# Patient Record
Sex: Female | Born: 1970 | Race: White | Hispanic: No | Marital: Married | Smoking: Current some day smoker
Health system: Southern US, Community
[De-identification: ages and names within clinical notes are randomized; demographics above are authoritative.]

## PROBLEM LIST (undated history)

## (undated) DIAGNOSIS — Z8679 Personal history of other diseases of the circulatory system: Secondary | ICD-10-CM

## (undated) HISTORY — PX: EYE SURGERY: SHX253

---

## 2005-09-21 HISTORY — PX: BACK SURGERY: SHX140

## 2006-07-29 ENCOUNTER — Ambulatory Visit: Payer: Self-pay | Admitting: Internal Medicine

## 2006-08-11 ENCOUNTER — Ambulatory Visit (HOSPITAL_COMMUNITY): Admission: RE | Admit: 2006-08-11 | Discharge: 2006-08-12 | Payer: Self-pay | Admitting: Neurological Surgery

## 2006-09-06 ENCOUNTER — Encounter: Admission: RE | Admit: 2006-09-06 | Discharge: 2006-09-06 | Payer: Self-pay | Admitting: Neurological Surgery

## 2008-01-18 ENCOUNTER — Ambulatory Visit: Payer: Self-pay

## 2008-09-21 HISTORY — PX: BREAST SURGERY: SHX581

## 2013-06-21 ENCOUNTER — Other Ambulatory Visit: Payer: Self-pay | Admitting: Neurological Surgery

## 2013-06-26 ENCOUNTER — Other Ambulatory Visit (HOSPITAL_COMMUNITY): Payer: Self-pay | Admitting: *Deleted

## 2013-06-26 ENCOUNTER — Encounter (HOSPITAL_COMMUNITY)
Admission: RE | Admit: 2013-06-26 | Discharge: 2013-06-26 | Disposition: A | Discharge status: Home / Self Care | Payer: Managed Care, Other (non HMO) | Source: Ambulatory Visit | Attending: Neurological Surgery | Admitting: Neurological Surgery

## 2013-06-26 ENCOUNTER — Encounter (HOSPITAL_COMMUNITY): Payer: Self-pay

## 2013-06-26 HISTORY — DX: Personal history of other diseases of the circulatory system: Z86.79

## 2013-06-26 LAB — CBC WITH DIFFERENTIAL/PLATELET
Basophils Absolute: 0 K/uL (ref 0.0–0.1)
Basophils Relative: 0 % (ref 0–1)
Eosinophils Absolute: 0.2 K/uL (ref 0.0–0.7)
Eosinophils Relative: 3 % (ref 0–5)
HCT: 42.5 % (ref 36.0–46.0)
Hemoglobin: 13.9 g/dL (ref 12.0–15.0)
Lymphocytes Relative: 20 % (ref 12–46)
Lymphs Abs: 1.7 K/uL (ref 0.7–4.0)
MCH: 31.2 pg (ref 26.0–34.0)
MCHC: 32.7 g/dL (ref 30.0–36.0)
MCV: 95.5 fL (ref 78.0–100.0)
Monocytes Absolute: 0.5 K/uL (ref 0.1–1.0)
Monocytes Relative: 6 % (ref 3–12)
Neutro Abs: 6.2 K/uL (ref 1.7–7.7)
Neutrophils Relative %: 71 % (ref 43–77)
Platelets: 246 K/uL (ref 150–400)
RBC: 4.45 MIL/uL (ref 3.87–5.11)
RDW: 13.1 % (ref 11.5–15.5)
WBC: 8.7 K/uL (ref 4.0–10.5)

## 2013-06-26 LAB — BASIC METABOLIC PANEL
CO2: 26 mEq/L (ref 19–32)
Calcium: 9.1 mg/dL (ref 8.4–10.5)
Chloride: 102 mEq/L (ref 96–112)
Creatinine, Ser: 0.62 mg/dL (ref 0.50–1.10)
GFR calc non Af Amer: >90 mL/min (ref >90)
Glucose, Bld: 87 mg/dL (ref 70–99)
Sodium: 138 mEq/L (ref 135–145)

## 2013-06-26 LAB — HCG, SERUM, QUALITATIVE: Preg, Serum: NEGATIVE

## 2013-06-26 LAB — PROTIME-INR
INR: 0.93 (ref 0.00–1.49)
Prothrombin Time: 12.3 s (ref 11.6–15.2)

## 2013-06-26 LAB — SURGICAL PCR SCREEN
MRSA, PCR: NEGATIVE
Staphylococcus aureus: NEGATIVE

## 2013-06-26 NOTE — Pre-Procedure Instructions (Signed)
Faline L Tanner  06/26/2013   Your procedure is scheduled on:  Thursday, June 29, 2013 at 3:45 PM.   Report to [facility] Entrance "A" at 1:45 PM.   Call this number if you have problems the morning of surgery: [phone]   Remember:   Do not eat food or drink liquids after midnight Wednesday, 06/28/13.   Take these medicines the morning of surgery with A SIP OF WATER: HYDROcodone-acetaminophen (VICODIN) Stop Ibuprofen as of today.   Do not wear jewelry, make-up or nail polish.  Do not wear lotions, powders, or perfumes. You may wear deodorant.  Do not shave 48 hours prior to surgery.   Do not bring valuables to the hospital.  [facility] is not responsible                  for any belongings or valuables.               Contacts, dentures or bridgework may not be worn into surgery.  Leave suitcase in the car. After surgery it may be brought to your room.  For patients admitted to the hospital, discharge time is determined by your                treatment team.                 Special Instructions: Shower using CHG 2 nights before surgery and the night before surgery.  If you shower the day of surgery use CHG.  Use special wash - you have one bottle of CHG for all showers.  You should use approximately 1/3 of the bottle for each shower.   Please read over the following fact sheets that you were given: Pain Booklet, Coughing and Deep Breathing, MRSA Information and Surgical Site Infection Prevention

## 2013-06-27 NOTE — Progress Notes (Signed)
Anesthesia Chart Review:  Patient is a 42 year old female scheduled for C6-7 ACDF, removal of C5-6 plate on 81/19/14 by Dr. Yetta Barre.  History includes smoking, MVP, neck surgery '07, breast augmentation '10, right eye surgery for amblyopia as a child.  No PCP is listed.  EKG on 06/26/13 showed NSR, possible LAE, low voltage QRS, cannot rule out anterior infarct (age undetermined).  The interpreting cardiology did not feel that it was significantly changed since her last tracing--although I could not locate a previous tracing in Epic or Muse. No CV symptoms were documented at her PAT visit.  CXR on 06/26/13 showed no active cardiopulmonary disease.  Preoperative labs noted.  She has a history of MVP and smoking, otherwise no known history of HTN, CAD, MI, CHF, or DM.  Clinical correlation on the day of surgery, but if she remains asymptomatic then I would anticipate that she could proceed as planned.  Velna Ochs [facility]/Anesthesiology Phone [phone] 06/27/2013 11:18 AM

## 2013-06-28 MED ORDER — CEFAZOLIN SODIUM-DEXTROSE 2-3 GM-% IV SOLR
2.0000 g | INTRAVENOUS | Status: AC
  Administered 2013-06-29: 2 g via INTRAVENOUS
  Filled 2013-06-28: qty 50

## 2013-06-28 NOTE — Progress Notes (Signed)
Contacted pt regarding new arrival time; pt stated that she was told to arrive at 11:00AM on DOS.

## 2013-06-29 ENCOUNTER — Ambulatory Visit (HOSPITAL_COMMUNITY)
Admission: RE | Admit: 2013-06-29 | Discharge: 2013-06-30 | Disposition: A | Discharge status: Home / Self Care | Payer: Managed Care, Other (non HMO) | Source: Ambulatory Visit | Attending: Neurological Surgery | Admitting: Neurological Surgery

## 2013-06-29 ENCOUNTER — Encounter (HOSPITAL_COMMUNITY): Payer: Managed Care, Other (non HMO) | Admitting: Vascular Surgery

## 2013-06-29 ENCOUNTER — Ambulatory Visit (HOSPITAL_COMMUNITY): Payer: Managed Care, Other (non HMO) | Admitting: Certified Registered Nurse Anesthetist

## 2013-06-29 ENCOUNTER — Observation Stay (HOSPITAL_COMMUNITY): Payer: Managed Care, Other (non HMO)

## 2013-06-29 ENCOUNTER — Encounter (HOSPITAL_COMMUNITY): Payer: Self-pay | Admitting: *Deleted

## 2013-06-29 ENCOUNTER — Encounter (HOSPITAL_COMMUNITY)
Admission: RE | Disposition: A | Discharge status: Home / Self Care | Payer: Self-pay | Source: Ambulatory Visit | Attending: Neurological Surgery

## 2013-06-29 DIAGNOSIS — F17200 Nicotine dependence, unspecified, uncomplicated: Secondary | ICD-10-CM | POA: Insufficient documentation

## 2013-06-29 DIAGNOSIS — Z981 Arthrodesis status: Secondary | ICD-10-CM | POA: Insufficient documentation

## 2013-06-29 DIAGNOSIS — M5020 Other cervical disc displacement, unspecified cervical region: Secondary | ICD-10-CM | POA: Insufficient documentation

## 2013-06-29 HISTORY — PX: ANTERIOR CERVICAL DECOMP/DISCECTOMY FUSION: SHX1161

## 2013-06-29 SURGERY — ANTERIOR CERVICAL DECOMPRESSION/DISCECTOMY FUSION 1 LEVEL/HARDWARE REMOVAL
Anesthesia: General | Site: Neck | Wound class: Clean

## 2013-06-29 MED ORDER — GLYCOPYRROLATE 0.2 MG/ML IJ SOLN
INTRAMUSCULAR | Status: DC | PRN
  Administered 2013-06-29: 0.4 mg via INTRAVENOUS

## 2013-06-29 MED ORDER — PROMETHAZINE HCL 25 MG/ML IJ SOLN: 6.2500 mg | INTRAMUSCULAR | Status: DC | PRN

## 2013-06-29 MED ORDER — DEXAMETHASONE SODIUM PHOSPHATE 10 MG/ML IJ SOLN
INTRAMUSCULAR | Status: AC
  Filled 2013-06-29: qty 1

## 2013-06-29 MED ORDER — DEXAMETHASONE SODIUM PHOSPHATE 4 MG/ML IJ SOLN
4.0000 mg | Freq: Four times a day (QID) | INTRAMUSCULAR | Status: DC
  Administered 2013-06-29: 4 mg via INTRAVENOUS
  Filled 2013-06-29 (×5): qty 1

## 2013-06-29 MED ORDER — SODIUM CHLORIDE 0.9 % IJ SOLN: 3.0000 mL | INTRAMUSCULAR | Status: DC | PRN

## 2013-06-29 MED ORDER — ACETAMINOPHEN 325 MG PO TABS: 650.0000 mg | ORAL_TABLET | ORAL | Status: DC | PRN

## 2013-06-29 MED ORDER — DEXAMETHASONE 4 MG PO TABS
4.0000 mg | ORAL_TABLET | Freq: Four times a day (QID) | ORAL | Status: DC
  Administered 2013-06-30 (×2): 4 mg via ORAL
  Filled 2013-06-29 (×7): qty 1

## 2013-06-29 MED ORDER — OXYCODONE HCL 5 MG PO TABS: 5.0000 mg | ORAL_TABLET | Freq: Once | ORAL | Status: DC | PRN

## 2013-06-29 MED ORDER — HEMOSTATIC AGENTS (NO CHARGE) OPTIME
TOPICAL | Status: DC | PRN
  Administered 2013-06-29: 1 via TOPICAL

## 2013-06-29 MED ORDER — THROMBIN 5000 UNITS EX SOLR
CUTANEOUS | Status: DC | PRN
  Administered 2013-06-29 (×2): 5000 [IU] via TOPICAL

## 2013-06-29 MED ORDER — MIDAZOLAM HCL 2 MG/2ML IJ SOLN: 0.5000 mg | Freq: Once | INTRAMUSCULAR | Status: DC | PRN

## 2013-06-29 MED ORDER — BUPIVACAINE HCL (PF) 0.25 % IJ SOLN
INTRAMUSCULAR | Status: DC | PRN
  Administered 2013-06-29: 3 mL

## 2013-06-29 MED ORDER — MEPERIDINE HCL 25 MG/ML IJ SOLN: 6.2500 mg | INTRAMUSCULAR | Status: DC | PRN

## 2013-06-29 MED ORDER — MIDAZOLAM HCL 5 MG/5ML IJ SOLN
INTRAMUSCULAR | Status: DC | PRN
  Administered 2013-06-29 (×2): 2 mg via INTRAVENOUS

## 2013-06-29 MED ORDER — ONDANSETRON HCL 4 MG/2ML IJ SOLN
INTRAMUSCULAR | Status: DC | PRN
  Administered 2013-06-29: 4 mg via INTRAMUSCULAR

## 2013-06-29 MED ORDER — NEOSTIGMINE METHYLSULFATE 1 MG/ML IJ SOLN
INTRAMUSCULAR | Status: DC | PRN
  Administered 2013-06-29: 3 mg via INTRAVENOUS

## 2013-06-29 MED ORDER — ACETAMINOPHEN 650 MG RE SUPP: 650.0000 mg | RECTAL | Status: DC | PRN

## 2013-06-29 MED ORDER — LACTATED RINGERS IV SOLN
INTRAVENOUS | Status: DC | PRN
  Administered 2013-06-29 (×2): via INTRAVENOUS

## 2013-06-29 MED ORDER — LACTATED RINGERS IV SOLN
INTRAVENOUS | Status: DC
  Administered 2013-06-29: 11:00:00 via INTRAVENOUS

## 2013-06-29 MED ORDER — THROMBIN 5000 UNITS EX SOLR
OROMUCOSAL | Status: DC | PRN
  Administered 2013-06-29: 14:00:00 via TOPICAL

## 2013-06-29 MED ORDER — HYDROCODONE-ACETAMINOPHEN 5-325 MG PO TABS
ORAL_TABLET | ORAL | Status: AC
  Filled 2013-06-29: qty 2

## 2013-06-29 MED ORDER — CYCLOBENZAPRINE HCL 10 MG PO TABS
10.0000 mg | ORAL_TABLET | Freq: Three times a day (TID) | ORAL | Status: DC | PRN
  Administered 2013-06-29: 10 mg via ORAL

## 2013-06-29 MED ORDER — HYDROCODONE-ACETAMINOPHEN 5-325 MG PO TABS
1.0000 | ORAL_TABLET | ORAL | Status: DC | PRN
  Administered 2013-06-29 – 2013-06-30 (×5): 2 via ORAL
  Filled 2013-06-29 (×4): qty 2

## 2013-06-29 MED ORDER — SODIUM CHLORIDE 0.9 % IR SOLN
Status: DC | PRN
  Administered 2013-06-29: 14:00:00

## 2013-06-29 MED ORDER — PROPOFOL 10 MG/ML IV BOLUS
INTRAVENOUS | Status: DC | PRN
  Administered 2013-06-29: 200 mg via INTRAVENOUS

## 2013-06-29 MED ORDER — POTASSIUM CHLORIDE IN NACL 20-0.9 MEQ/L-% IV SOLN
INTRAVENOUS | Status: DC
  Administered 2013-06-29: 19:00:00 via INTRAVENOUS
  Filled 2013-06-29 (×3): qty 1000

## 2013-06-29 MED ORDER — DEXAMETHASONE SODIUM PHOSPHATE 10 MG/ML IJ SOLN
10.0000 mg | INTRAMUSCULAR | Status: AC
  Administered 2013-06-29: 10 mg via INTRAVENOUS

## 2013-06-29 MED ORDER — ARTIFICIAL TEARS OP OINT
TOPICAL_OINTMENT | OPHTHALMIC | Status: DC | PRN
  Administered 2013-06-29: 1 via OPHTHALMIC

## 2013-06-29 MED ORDER — EPHEDRINE SULFATE 50 MG/ML IJ SOLN
INTRAMUSCULAR | Status: DC | PRN
  Administered 2013-06-29 (×3): 5 mg via INTRAVENOUS

## 2013-06-29 MED ORDER — ROCURONIUM BROMIDE 100 MG/10ML IV SOLN
INTRAVENOUS | Status: DC | PRN
  Administered 2013-06-29: 50 mg via INTRAVENOUS

## 2013-06-29 MED ORDER — SODIUM CHLORIDE 0.9 % IJ SOLN: 3.0000 mL | Freq: Two times a day (BID) | INTRAMUSCULAR | Status: DC

## 2013-06-29 MED ORDER — CYCLOBENZAPRINE HCL 10 MG PO TABS
ORAL_TABLET | ORAL | Status: AC
  Filled 2013-06-29: qty 1

## 2013-06-29 MED ORDER — ONDANSETRON HCL 4 MG/2ML IJ SOLN: 4.0000 mg | INTRAMUSCULAR | Status: DC | PRN

## 2013-06-29 MED ORDER — PHENOL 1.4 % MT LIQD: 1.0000 | OROMUCOSAL | Status: DC | PRN

## 2013-06-29 MED ORDER — FENTANYL CITRATE 0.05 MG/ML IJ SOLN
INTRAMUSCULAR | Status: DC | PRN
  Administered 2013-06-29: 100 ug via INTRAVENOUS
  Administered 2013-06-29: 50 ug via INTRAVENOUS
  Administered 2013-06-29: 100 ug via INTRAVENOUS
  Administered 2013-06-29: 50 ug via INTRAVENOUS

## 2013-06-29 MED ORDER — HYDROMORPHONE HCL PF 1 MG/ML IJ SOLN: 0.2500 mg | INTRAMUSCULAR | Status: DC | PRN

## 2013-06-29 MED ORDER — MORPHINE SULFATE 2 MG/ML IJ SOLN
1.0000 mg | INTRAMUSCULAR | Status: DC | PRN
Start: 2013-06-29 — End: 2013-06-30

## 2013-06-29 MED ORDER — SODIUM CHLORIDE 0.9 % IV SOLN: 250.0000 mL | INTRAVENOUS | Status: DC

## 2013-06-29 MED ORDER — 0.9 % SODIUM CHLORIDE (POUR BTL) OPTIME
TOPICAL | Status: DC | PRN
  Administered 2013-06-29: 1000 mL

## 2013-06-29 MED ORDER — MENTHOL 3 MG MT LOZG: 1.0000 | LOZENGE | OROMUCOSAL | Status: DC | PRN

## 2013-06-29 MED ORDER — CELECOXIB 200 MG PO CAPS
200.0000 mg | ORAL_CAPSULE | Freq: Two times a day (BID) | ORAL | Status: DC
  Filled 2013-06-29 (×3): qty 1

## 2013-06-29 MED ORDER — CEFAZOLIN SODIUM 1-5 GM-% IV SOLN
1.0000 g | Freq: Three times a day (TID) | INTRAVENOUS | Status: AC
  Administered 2013-06-29 – 2013-06-30 (×2): 1 g via INTRAVENOUS
  Filled 2013-06-29 (×2): qty 50

## 2013-06-29 MED ORDER — OXYCODONE HCL 5 MG/5ML PO SOLN: 5.0000 mg | Freq: Once | ORAL | Status: DC | PRN

## 2013-06-29 MED ORDER — LIDOCAINE HCL (CARDIAC) 20 MG/ML IV SOLN
INTRAVENOUS | Status: DC | PRN
  Administered 2013-06-29: 40 mg via INTRAVENOUS

## 2013-06-29 SURGICAL SUPPLY — 49 items
ALLOGRAFT TRIAD LORDOTIC CC (Bone Implant) ×1 IMPLANT
APL SKNCLS STERI-STRIP NONHPOA (GAUZE/BANDAGES/DRESSINGS) ×1
BAG DECANTER FOR FLEXI CONT (MISCELLANEOUS) ×2 IMPLANT
BENZOIN TINCTURE PRP APPL 2/3 (GAUZE/BANDAGES/DRESSINGS) ×2 IMPLANT
BIT DRILL (BIT) ×1
BIT DRILL 11MM (BIT) IMPLANT
BUR MATCHSTICK NEURO 3.0 LAGG (BURR) ×2 IMPLANT
CANISTER SUCTION 2500CC (MISCELLANEOUS) ×2 IMPLANT
CLOTH BEACON ORANGE TIMEOUT ST (SAFETY) ×2 IMPLANT
CONT SPEC 4OZ CLIKSEAL STRL BL (MISCELLANEOUS) ×2 IMPLANT
DRAPE C-ARM 42X72 X-RAY (DRAPES) ×4 IMPLANT
DRAPE LAPAROTOMY 100X72 PEDS (DRAPES) ×2 IMPLANT
DRAPE MICROSCOPE ZEISS OPMI (DRAPES) ×2 IMPLANT
DRAPE POUCH INSTRU U-SHP 10X18 (DRAPES) ×2 IMPLANT
DRESSING TELFA 8X3 (GAUZE/BANDAGES/DRESSINGS) ×2 IMPLANT
DRSG OPSITE 4X5.5 SM (GAUZE/BANDAGES/DRESSINGS) ×2 IMPLANT
DURAPREP 6ML APPLICATOR 50/CS (WOUND CARE) ×2 IMPLANT
ELECT COATED BLADE 2.86 ST (ELECTRODE) ×2 IMPLANT
ELECT REM PT RETURN 9FT ADLT (ELECTROSURGICAL) ×2
ELECTRODE REM PT RTRN 9FT ADLT (ELECTROSURGICAL) ×1 IMPLANT
GAUZE SPONGE 4X4 16PLY XRAY LF (GAUZE/BANDAGES/DRESSINGS) IMPLANT
GLOVE BIO SURGEON STRL SZ8 (GLOVE) ×2 IMPLANT
GOWN BRE IMP SLV AUR LG STRL (GOWN DISPOSABLE) IMPLANT
GOWN BRE IMP SLV AUR XL STRL (GOWN DISPOSABLE) IMPLANT
GOWN STRL REIN 2XL LVL4 (GOWN DISPOSABLE) ×2 IMPLANT
HEAD HALTER (SOFTGOODS) IMPLANT
HEMOSTAT POWDER KIT SURGIFOAM (HEMOSTASIS) ×2 IMPLANT
KIT BASIN OR (CUSTOM PROCEDURE TRAY) ×2 IMPLANT
KIT ROOM TURNOVER OR (KITS) ×2 IMPLANT
NDL HYPO 25X1 1.5 SAFETY (NEEDLE) ×1 IMPLANT
NDL SPNL 20GX3.5 QUINCKE YW (NEEDLE) ×1 IMPLANT
NEEDLE HYPO 25X1 1.5 SAFETY (NEEDLE) ×2 IMPLANT
NEEDLE SPNL 20GX3.5 QUINCKE YW (NEEDLE) ×2 IMPLANT
NS IRRIG 1000ML POUR BTL (IV SOLUTION) ×2 IMPLANT
PACK LAMINECTOMY NEURO (CUSTOM PROCEDURE TRAY) ×2 IMPLANT
PAD ARMBOARD 7.5X6 YLW CONV (MISCELLANEOUS) ×6 IMPLANT
PLATE ACP VENTURE (Plate) ×1 IMPLANT
RUBBERBAND STERILE (MISCELLANEOUS) ×4 IMPLANT
SCREW VA 4.0X13MM (Screw) ×2 IMPLANT
SCREW VA 4.5X13MM (Screw) ×2 IMPLANT
SPONGE INTESTINAL PEANUT (DISPOSABLE) ×2 IMPLANT
STRIP CLOSURE SKIN 1/2X4 (GAUZE/BANDAGES/DRESSINGS) ×2 IMPLANT
SUT VIC AB 3-0 SH 8-18 (SUTURE) ×2 IMPLANT
SYR 20ML ECCENTRIC (SYRINGE) ×2 IMPLANT
TAPE STRIPS DRAPE STRL (GAUZE/BANDAGES/DRESSINGS) ×1 IMPLANT
TOWEL OR 17X24 6PK STRL BLUE (TOWEL DISPOSABLE) ×2 IMPLANT
TOWEL OR 17X26 10 PK STRL BLUE (TOWEL DISPOSABLE) ×2 IMPLANT
TRAP SPECIMEN MUCOUS 40CC (MISCELLANEOUS) IMPLANT
WATER STERILE IRR 1000ML POUR (IV SOLUTION) ×2 IMPLANT

## 2013-06-29 NOTE — H&P (Signed)
Subjective:   Patient is a 42 y.o. female admitted for ACDF C6-7. The patient first presented to me with complaints of neck pain, shooting pains in the arm(s) and numbness of the arm(s). Onset of symptoms was a few weeks ago. The pain is described as sharp and stabbing and occurs all day. The pain is rated intense, and is located  At the base of the neck and radiates to the RUE. The symptoms have been progressive. Symptoms are exacerbated by extending head backwards, and are relieved by none.  Previous work up includes MRI of cervical spine, results: disc bulge at C6-C7 right.  Past Medical History  Diagnosis Date  . H/O mitral valve prolapse     states she has been told that she no longer has this    Past Surgical History  Procedure Laterality Date  . 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 Breast surgery  2010    augmentation  . Back surgery  2007    neck surgery    No Known Allergies  History  Substance Use Topics  . Smoking status: Current Some Day Smoker  . Smokeless tobacco: Never Used  . Alcohol Use: Yes     Comment: socially    History reviewed. No pertinent family history. Prior to Admission medications   Medication Sig Start Date End Date Taking? Authorizing Provider  HYDROcodone-acetaminophen (VICODIN) 5-500 MG per tablet Take 1 tablet by mouth every 6 (six) hours as needed for pain.   Yes Historical Provider, MD  ibuprofen (ADVIL,MOTRIN) 800 MG tablet Take 800 mg by mouth every 8 (eight) hours as needed for pain.   Yes Historical Provider, MD     Review of Systems  Positive ROS: neg  All other systems have been reviewed and were otherwise negative with the exception of those mentioned in the HPI and as above.  Objective: Vital signs in last 24 hours: Temp:  [98.3 F (36.8 C)] 98.3 F (36.8 C) (10/09 1047) Pulse Rate:  [68] 68 (10/09 1047) Resp:  [18] 18 (10/09 1047) BP: (135)/(91) 135/91 mmHg (10/09 1047) SpO2:  [99 %] 99 % (10/09  1047)  General Appearance: Alert, cooperative, no distress, appears stated age Head: Normocephalic, without obvious abnormality, atraumatic Eyes: PERRL, conjunctiva/corneas clear, EOM's intact      Neck: Supple, symmetrical, trachea midline, Back: Symmetric, no curvature, ROM normal, no CVA tenderness Lungs:  respirations unlabored Heart: Regular rate and rhythm Abdomen: Soft, non-tender Extremities: Extremities normal, atraumatic, no cyanosis or edema Pulses: 2+ and symmetric all extremities Skin: Skin color, texture, turgor normal, no rashes or lesions  NEUROLOGIC:  Mental status: Alert and oriented x4, no aphasia, good attention span, fund of knowledge and memory  Motor Exam - grossly normal Sensory Exam - grossly normal Reflexes: 1+ Coordination - grossly normal Gait - grossly normal Balance - grossly normal Cranial Nerves: I: smell Not tested  II: visual acuity  OS: nl    OD: nl  II: visual fields Full to confrontation  II: pupils Equal, round, reactive to light  III,VII: ptosis None  III,IV,VI: extraocular muscles  Full ROM  V: mastication Normal  V: facial light touch sensation  Normal  V,VII: corneal reflex  Present  VII: facial muscle function - upper  Normal  VII: facial muscle function - lower Normal  VIII: hearing Not tested  IX: soft palate elevation  Normal  IX,X: gag reflex Present  XI: trapezius strength  5/5  XI: sternocleidomastoid  strength 5/5  XI: neck flexion strength  5/5  XII: tongue strength  Normal    Data Review Lab Results  Component Value Date   WBC 8.7 06/26/2013   HGB 13.9 06/26/2013   HCT 42.5 06/26/2013   MCV 95.5 06/26/2013   PLT 246 06/26/2013   Lab Results  Component Value Date   NA 138 06/26/2013   K 3.8 06/26/2013   CL 102 06/26/2013   CO2 26 06/26/2013   BUN 15 06/26/2013   CREATININE 0.62 06/26/2013   GLUCOSE 87 06/26/2013   Lab Results  Component Value Date   INR 0.93 06/26/2013    Assessment:   Cervical neck pain with  herniated nucleus pulposus/ spondylosis/ stenosis at C6_7. Patient has failed conservative therapy. Planned surgery : ACDF C6-7  Plan:   I explained the condition and procedure to the patient and answered any questions.  Patient wishes to proceed with procedure as planned. Understands risks/ benefits/ and expected or typical outcomes.  Raysa Bosak S 06/29/2013 1:00 PM

## 2013-06-29 NOTE — Anesthesia Preprocedure Evaluation (Signed)
Anesthesia Evaluation  Patient identified by MRN, date of birth, ID band Patient awake    Reviewed: Allergy & Precautions, H&P , NPO status , Patient's Chart, lab work & pertinent test results  History of Anesthesia Complications Negative for: history of anesthetic complications  Airway Mallampati: II TM Distance: >3 FB Neck ROM: Full    Dental  (+) Teeth Intact and Dental Advisory Given   Pulmonary Current Smoker,  breath sounds clear to auscultation  Pulmonary exam normal       Cardiovascular negative cardio ROS  Rhythm:Regular Rate:Normal     Neuro/Psych Chronic neck pain: narcotics daily    GI/Hepatic negative GI ROS, Neg liver ROS,   Endo/Other  negative endocrine ROS  Renal/GU negative Renal ROS     Musculoskeletal   Abdominal   Peds  Hematology   Anesthesia Other Findings   Reproductive/Obstetrics IUD, LMP last week                           Anesthesia Physical Anesthesia Plan  ASA: II  Anesthesia Plan: General   Post-op Pain Management:    Induction: Intravenous  Airway Management Planned: Oral ETT  Additional Equipment:   Intra-op Plan:   Post-operative Plan: Extubation in OR  Informed Consent: I have reviewed the patients History and Physical, chart, labs and discussed the procedure including the risks, benefits and alternatives for the proposed anesthesia with the patient or authorized representative who has indicated his/her understanding and acceptance.   Dental advisory given  Plan Discussed with: CRNA and Surgeon  Anesthesia Plan Comments: (Plan routine monitors, GETA)        Anesthesia Quick Evaluation

## 2013-06-29 NOTE — Preoperative (Signed)
Beta Blockers   Reason not to administer Beta Blockers:Not Applicable 

## 2013-06-29 NOTE — Op Note (Signed)
06/29/2013  2:51 PM  PATIENT:  Monique Green  42 y.o. 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 Decompressive anterior cervical discectomy C6-7, 2. Removal of C5-6 anterior cervical plate, 3. Anterior cervical arthrodesis C6-7 utilizing a cortical cancellus allograft, 4. Anterior cervical plating C6-7 utilizing an Atlantis venture plate  SURGEON:  Marikay Alar, MD  ASSISTANTS: Dr. Newell Coral  ANESTHESIA:   General  EBL: 30 ml  Total I/O In: 1000 [I.V.:1000] Out: 30 [Blood:30]  BLOOD ADMINISTERED:none  DRAINS: None   SPECIMEN:  No Specimen  INDICATION FOR PROCEDURE: This patient presented with severe right arm pain. She had a previous ACDF with plating at C5-6. Imaging showed a large cervical disc herniation C6-7on the right. Recommended decompressive anterior cervical discectomy fusion plating at C6-7. Patient understood the risks, benefits, and alternatives and potential outcomes and wished to proceed.  PROCEDURE DETAILS: Patient was brought to the operating room placed under general endotracheal anesthesia. Patient was placed in the supine position on the operating room table. The neck was prepped with Duraprep and draped in a sterile fashion.   Three cc of local anesthesia was injected and a transverse incision was made on the right side of the neck.  Dissection was carried down thru the subcutaneous tissue and the platysma was  elevated, opened, and undermined with Metzenbaum scissors.  Dissection was then carried out thru an avascular plane leaving the sternocleidomastoid carotid artery and jugular vein laterally and the trachea and esophagus medially. The ventral aspect of the vertebral column was identified and a localizing x-ray was taken. The C6-7 level and the previous anterior cervical plate were identified. I bluntly removed the soft tissues from the surface of  the previously placed anterior cervical plate. I removed the 4 screws, filled the screw holes with Surgifoam , and removed the previously placed plate. The longus colli muscles were then elevated and the retractor was placed to expose C6-7. The annulus was incised and the disc space entered. Discectomy was performed with micro-curettes and pituitary rongeurs. I then used the high-speed drill to drill the endplates down to the level of the posterior longitudinal ligament. The drill shavings were saved in a mucous trap for later arthrodesis. The operating microscope was draped and brought into the field provided additional magnification, illumination and visualization. Discectomy was continued posteriorly thru the disc space. Posterior longitudinal ligament was opened with a nerve hook, and then removed along with disc herniation and osteophytes, decompressing the spinal canal and thecal sac. We then continued to remove osteophytic overgrowth and disc material decompressing the neural foramina and exiting nerve roots bilaterally. The scope was angled up and down to help decompress and undercut the vertebral bodies. Once the decompression was completed we could pass a nerve hook circumferentially to assure adequate decompression in the midline and in the neural foramina. So by both visualization and palpation we felt we had an adequate decompression of the neural elements. We then measured the height of the intravertebral disc space and selected a 6 millimeter cortical cancellus allograft. It was then gently positioned in the intravertebral disc space and countersunk. I then used a Atlantis venture plate and placed four variable angle screws into the vertebral bodies and locked them into position. The wound was irrigated with bacitracin solution, checked for hemostasis which was established and confirmed. Once meticulous hemostasis was achieved, we then proceeded with closure. The platysma was closed with  interrupted  3-0 undyed Vicryl suture, the subcuticular layer was closed with interrupted 3-0 undyed Vicryl suture. The skin edges were approximated with steristrips. The drapes were removed. A sterile dressing was applied. The patient was then awakened from general anesthesia and transferred to the recovery room in stable condition. At the end of the procedure all sponge, needle and instrument counts were correct.   PLAN OF CARE: Admit for overnight observation  PATIENT DISPOSITION:  PACU - hemodynamically stable.   Delay start of Pharmacological VTE agent (>24hrs) due to surgical blood loss or risk of bleeding:  yes

## 2013-06-29 NOTE — Transfer of Care (Signed)
Immediate Anesthesia Transfer of Care Note  Patient: Monique Green  Procedure(s) Performed: Procedure(s) with comments: ANTERIOR CERVICAL DECOMPRESSION/DISCECTOMY FUSION 1 LEVEL/HARDWARE REMOVAL (N/A) - ANTERIOR CERVICAL DECOMPRESSION/DISCECTOMY FUSION 1 LEVEL/HARDWARE REMOVAL  Patient Location: PACU  Anesthesia Type:General  Level of Consciousness: awake, alert  and oriented  Airway & Oxygen Therapy: Patient Spontanous Breathing and Patient connected to nasal cannula oxygen  Post-op Assessment: Report given to PACU RN, Post -op Vital signs reviewed and stable and Patient moving all extremities X 4  Post vital signs: Reviewed and stable  Complications: No apparent anesthesia complications

## 2013-06-29 NOTE — Progress Notes (Signed)
Patient ID: Monique Green, female   DOB: [birth year], 42 y.o.   MRN: [id number] Doing well post-op. NO arm pain or N/T/W. Good strength.

## 2013-06-29 NOTE — Anesthesia Postprocedure Evaluation (Signed)
  Anesthesia Post-op Note  Patient: Monique Green  Procedure(s) Performed: Procedure(s) with comments: ANTERIOR CERVICAL DECOMPRESSION/DISCECTOMY FUSION 1 LEVEL/HARDWARE REMOVAL (N/A) - ANTERIOR CERVICAL DECOMPRESSION/DISCECTOMY FUSION 1 LEVEL/HARDWARE REMOVAL  Patient Location: PACU  Anesthesia Type:General  Level of Consciousness: awake, alert , oriented and patient cooperative  Airway and Oxygen Therapy: Patient Spontanous Breathing  Post-op Pain: mild  Post-op Assessment: Post-op Vital signs reviewed, Patient's Cardiovascular Status Stable, Respiratory Function Stable, Patent Airway, No signs of Nausea or vomiting and Pain level controlled  Post-op Vital Signs: Reviewed and stable  Complications: No apparent anesthesia complications

## 2013-06-30 ENCOUNTER — Encounter (HOSPITAL_COMMUNITY): Payer: Self-pay | Admitting: Neurological Surgery

## 2013-06-30 MED ORDER — CYCLOBENZAPRINE HCL 10 MG PO TABS: 10.0000 mg | ORAL_TABLET | Freq: Three times a day (TID) | ORAL | Status: AC | PRN

## 2013-06-30 MED ORDER — HYDROCODONE-ACETAMINOPHEN 5-325 MG PO TABS: 1.0000 | ORAL_TABLET | ORAL | Status: AC | PRN

## 2013-06-30 NOTE — Discharge Summary (Signed)
Physician Discharge Summary  Patient ID: Monique Green MRN: [id number] DOB/AGE: 1971/08/20 42 y.o.  Admit date: 06/29/2013 Discharge date: 06/30/2013  Admission Diagnoses: Cervical disc herniation    Discharge Diagnoses: Same   Discharged Condition: good  Hospital Course: The patient was admitted on 06/29/2013 and taken to the operating room where the patient underwent ACDF C6-7. The patient tolerated the procedure well and was taken to the recovery room and then to the floor in stable condition. The hospital course was routine. There were no complications. The wound remained clean dry and intact. Pt had appropriate neck soreness. No complaints of arm pain or new N/T/W. The patient remained afebrile with stable vital signs, and tolerated a regular diet. The patient continued to increase activities, and pain was well controlled with oral pain medications.   Consults: None  Significant Diagnostic Studies:  Results for orders placed during the hospital encounter of 06/26/13  SURGICAL PCR SCREEN      Result Value Range   MRSA, PCR NEGATIVE  NEGATIVE   Staphylococcus aureus NEGATIVE  NEGATIVE  HCG, SERUM, QUALITATIVE      Result Value Range   Preg, Serum NEGATIVE  NEGATIVE  BASIC METABOLIC PANEL      Result Value Range   Sodium 138  135 - 145 mEq/L   Potassium 3.8  3.5 - 5.1 mEq/L   Chloride 102  96 - 112 mEq/L   CO2 26  19 - 32 mEq/L   Glucose, Bld 87  70 - 99 mg/dL   BUN 15  6 - 23 mg/dL   Creatinine, Ser 4.09  0.50 - 1.10 mg/dL   Calcium 9.1  8.4 - 81.1 mg/dL   GFR calc non Af Amer >90  >90 mL/min   GFR calc Af Amer >90  >90 mL/min  CBC WITH DIFFERENTIAL      Result Value Range   WBC 8.7  4.0 - 10.5 K/uL   RBC 4.45  3.87 - 5.11 MIL/uL   Hemoglobin 13.9  12.0 - 15.0 g/dL   HCT 91.4  78.2 - 95.6 %   MCV 95.5  78.0 - 100.0 fL   MCH 31.2  26.0 - 34.0 pg   MCHC 32.7  30.0 - 36.0 g/dL   RDW 21.3  08.6 - 57.8 %   Platelets 246  150 - 400 K/uL   Neutrophils Relative % 71  43 -  77 %   Neutro Abs 6.2  1.7 - 7.7 K/uL   Lymphocytes Relative 20  12 - 46 %   Lymphs Abs 1.7  0.7 - 4.0 K/uL   Monocytes Relative 6  3 - 12 %   Monocytes Absolute 0.5  0.1 - 1.0 K/uL   Eosinophils Relative 3  0 - 5 %   Eosinophils Absolute 0.2  0.0 - 0.7 K/uL   Basophils Relative 0  0 - 1 %   Basophils Absolute 0.0  0.0 - 0.1 K/uL  PROTIME-INR      Result Value Range   Prothrombin Time 12.3  11.6 - 15.2 seconds   INR 0.93  0.00 - 1.49    Chest 2 View  06/26/2013   CLINICAL DATA:  Pre-admission image prior to cervical fusion hardware surgery  EXAM: CHEST  2 VIEW  COMPARISON:  None.  FINDINGS: The heart size and mediastinal contours are within normal limits. Both lungs are clear. The visualized skeletal structures are unremarkable. Cervical fusion hardware partially visualized. Mild leftward curvature of the thoracic spine noted centered at  T8. Pectus excavatum deformity noted, accounting for the appearance of the cardiomediastinal silhouette on the frontal projection. Oval soft tissue opacities over the chest may suggest the presence of breast implants.  IMPRESSION: No active cardiopulmonary disease.   Electronically Signed   By: Christiana Pellant M.D.   On: 06/26/2013 14:44   Dg Cervical Spine 1 View  06/29/2013   CLINICAL DATA:  C6-7 fusion with removal of C5-6 hardware.  EXAM: DG CERVICAL SPINE - 1 VIEW; DG C-ARM 1-60 MIN  COMPARISON:  09/06/2006 plain film exam.  Fluoroscopic time: 5 seconds.  FINDINGS: Two intraoperative C-arm views submitted for review after surgery.  Removal of hardware at the C5-6 level with bony fusion across the disc space.  C6-7 anterior fusion with interbody spacer without complication noted on lateral imaging. Shoulder slightly limited evaluation.  IMPRESSION: Removal of hardware at the C5-6 level and fusion C6-7 level as noted above.   Electronically Signed   By: Bridgett Larsson M.D.   On: 06/29/2013 16:10   Dg C-arm 1-60 Min  06/29/2013   CLINICAL DATA:  C6-7 fusion  with removal of C5-6 hardware.  EXAM: DG CERVICAL SPINE - 1 VIEW; DG C-ARM 1-60 MIN  COMPARISON:  09/06/2006 plain film exam.  Fluoroscopic time: 5 seconds.  FINDINGS: Two intraoperative C-arm views submitted for review after surgery.  Removal of hardware at the C5-6 level with bony fusion across the disc space.  C6-7 anterior fusion with interbody spacer without complication noted on lateral imaging. Shoulder slightly limited evaluation.  IMPRESSION: Removal of hardware at the C5-6 level and fusion C6-7 level as noted above.   Electronically Signed   By: Bridgett Larsson M.D.   On: 06/29/2013 16:10    Antibiotics:  Anti-infectives   Start     Dose/Rate Route Frequency Ordered Stop   06/29/13 2100  ceFAZolin (ANCEF) IVPB 1 g/50 mL premix     1 g 100 mL/hr over 30 Minutes Intravenous Every 8 hours 06/29/13 1552 06/30/13 0603   06/29/13 1342  bacitracin 50,000 Units in sodium chloride irrigation 0.9 % 500 mL irrigation  Status:  Discontinued       As needed 06/29/13 1342 06/29/13 1444   06/29/13 0600  ceFAZolin (ANCEF) IVPB 2 g/50 mL premix     2 g 100 mL/hr over 30 Minutes Intravenous On call to O.R. 06/28/13 1448 06/29/13 1325      Discharge Exam: Blood pressure 94/61, pulse 66, temperature 98 F (36.7 C), temperature source Oral, resp. rate 18, height 5\' 5"  (1.651 m), weight 66.2 kg (145 lb 15.1 oz), last menstrual period 06/21/2013, SpO2 98.00%. Neurologic: Grossly normal Incision CDI  Discharge Medications:     Medication List    STOP taking these medications       HYDROcodone-acetaminophen 5-500 MG per tablet  Commonly known as:  VICODIN  Replaced by:  HYDROcodone-acetaminophen 5-325 MG per tablet     ibuprofen 800 MG tablet  Commonly known as:  ADVIL,MOTRIN      TAKE these medications       cyclobenzaprine 10 MG tablet  Commonly known as:  FLEXERIL  Take 1 tablet (10 mg total) by mouth 3 (three) times daily as needed for muscle spasms.     HYDROcodone-acetaminophen 5-325  MG per tablet  Commonly known as:  NORCO/VICODIN  Take 1-2 tablets by mouth every 4 (four) hours as needed for pain.        Disposition: home   Final Dx: ACDF C6-7  Discharge Orders   Future Orders Complete By Expires   Call MD for:  difficulty breathing, headache or visual disturbances  As directed    Call MD for:  persistant nausea and vomiting  As directed    Call MD for:  redness, tenderness, or signs of infection (pain, swelling, redness, odor or green/yellow discharge around incision site)  As directed    Call MD for:  severe uncontrolled pain  As directed    Call MD for:  temperature >100.4  As directed    Diet - low sodium heart healthy  As directed    Discharge instructions  As directed    Comments:     No heavy lifting, may shower normally   Increase activity slowly  As directed       Follow-up Information   Follow up with Dannie Hattabaugh S, MD. Schedule an appointment as soon as possible for a visit in 2 weeks.   Specialty:  Neurosurgery   Contact information:   [address] [place] [address] [phone]        Signed: Tia Alert 06/30/2013, 8:29 AM

## 2015-01-04 IMAGING — RF DG C-ARM 61-120 MIN
1 series · 2 of 2 positions shown · non-contrast
Comparison: 09/06/2006 plain film exam.

Fluoroscopic time: 5 seconds.

CLINICAL DATA: C6-7 fusion with removal of C5-6 hardware.

EXAM:
DG CERVICAL SPINE - 1 VIEW; DG C-ARM 1-60 MIN

[Series 1: run · 2 of 2 slices shown]
[im 1/2]
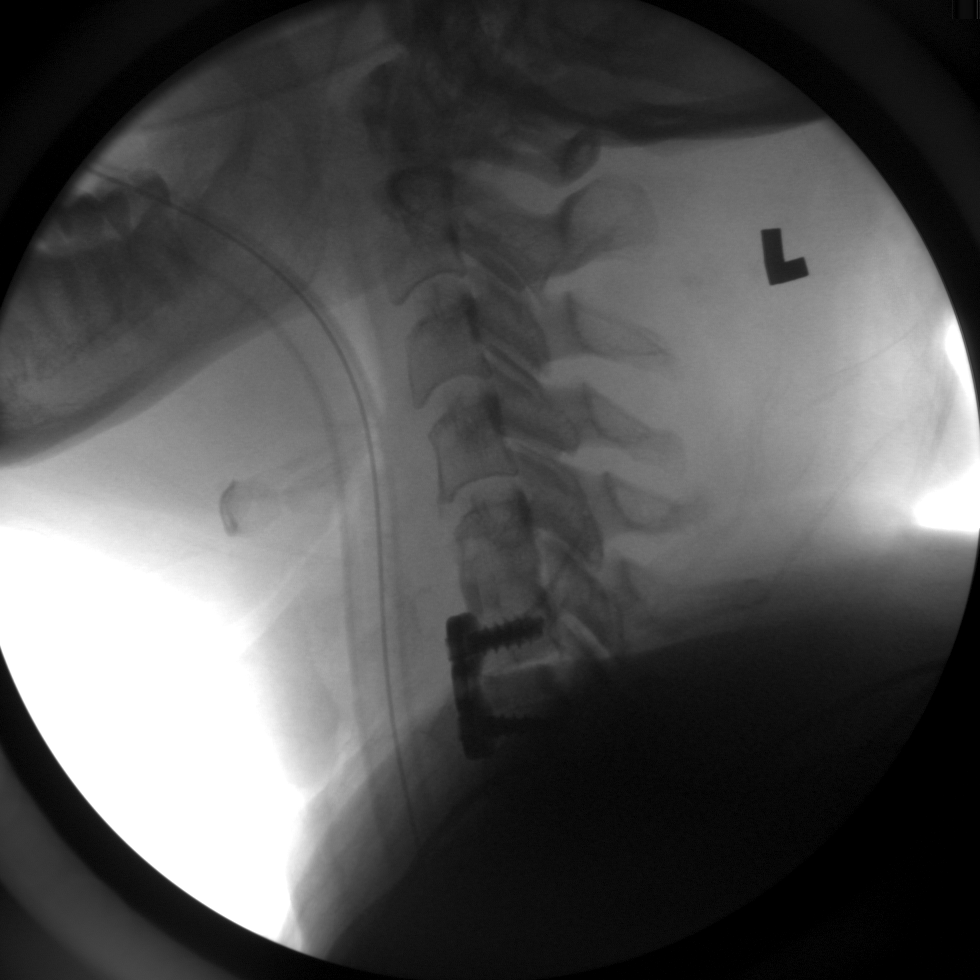
[im 2/2]
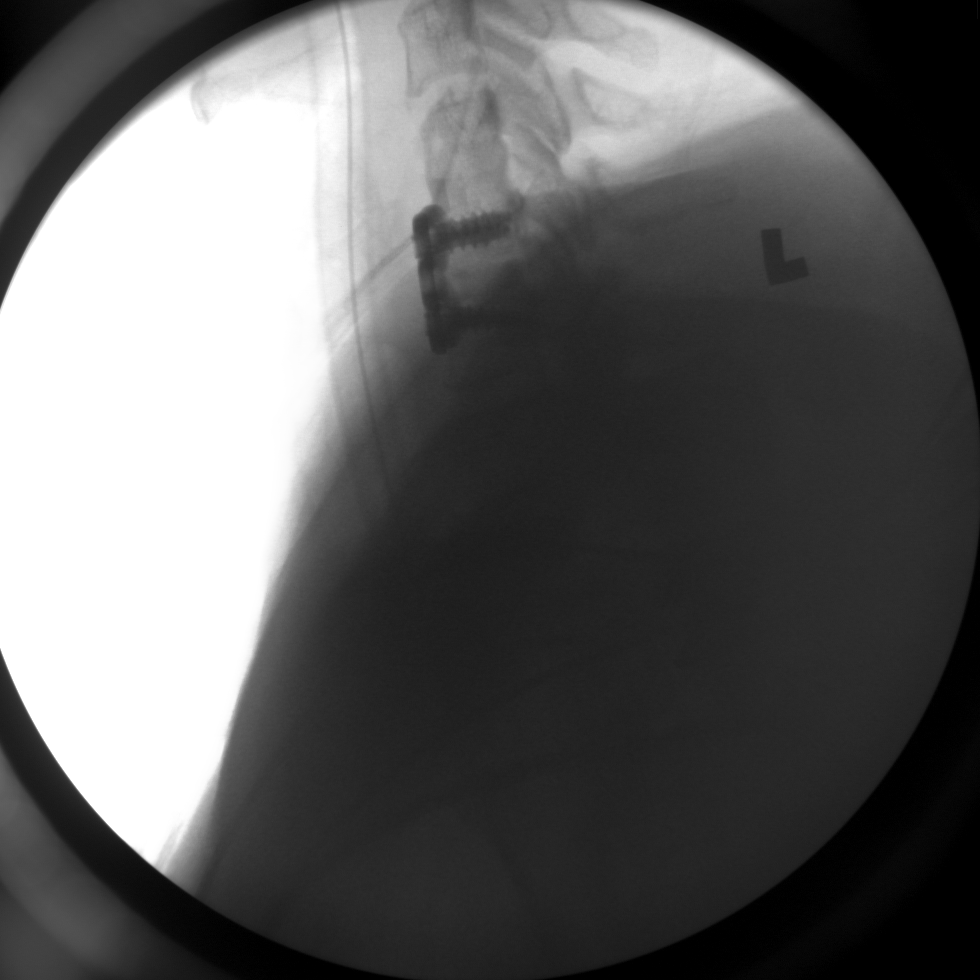

[2 of 2 positions shown; findings below may reference images not displayed]

FINDINGS: Two intraoperative C-arm views submitted for review after surgery.

Removal of hardware at the C5-6 level with bony fusion across the
disc space.

C6-7 anterior fusion with interbody spacer without complication
noted on lateral imaging. Shoulder slightly limited evaluation.
IMPRESSION: Removal of hardware at the C5-6 level and fusion C6-7 level as noted
above.

## 2022-07-14 ENCOUNTER — Telehealth: Payer: Self-pay | Admitting: Gastroenterology

## 2022-07-14 NOTE — Telephone Encounter (Signed)
This patient has never seen at [facility].

## 2022-07-14 NOTE — Telephone Encounter (Signed)
Pt called with severe abdominal pain would like a call back

## 2022-07-14 NOTE — Telephone Encounter (Signed)
Pt left message of severe abdomin pain would like a call back

## 2022-07-14 NOTE — Telephone Encounter (Signed)
This patient has never seen at [facility].  

## 2022-07-14 NOTE — Telephone Encounter (Signed)
error
